# Patient Record
Sex: Male | Born: 1945 | Race: White | Hispanic: No | Marital: Married | Smoking: Former smoker
Health system: Southern US, Community
[De-identification: ages and names within clinical notes are randomized; demographics above are authoritative.]

## PROBLEM LIST (undated history)

## (undated) DIAGNOSIS — E119 Type 2 diabetes mellitus without complications: Secondary | ICD-10-CM

## (undated) DIAGNOSIS — I10 Essential (primary) hypertension: Secondary | ICD-10-CM

## (undated) HISTORY — PX: LEG SURGERY: SHX1003

## (undated) HISTORY — PX: OTHER SURGICAL HISTORY: SHX169

---

## 2013-03-10 ENCOUNTER — Emergency Department (HOSPITAL_COMMUNITY)
Admission: EM | Admit: 2013-03-10 | Discharge: 2013-03-10 | Disposition: A | Discharge status: Home / Self Care | Payer: Medicare Other | Attending: Emergency Medicine | Admitting: Emergency Medicine

## 2013-03-10 ENCOUNTER — Encounter (HOSPITAL_COMMUNITY): Payer: Self-pay | Admitting: Emergency Medicine

## 2013-03-10 DIAGNOSIS — X58XXXA Exposure to other specified factors, initial encounter: Secondary | ICD-10-CM | POA: Insufficient documentation

## 2013-03-10 DIAGNOSIS — Y929 Unspecified place or not applicable: Secondary | ICD-10-CM | POA: Diagnosis not present

## 2013-03-10 DIAGNOSIS — I10 Essential (primary) hypertension: Secondary | ICD-10-CM | POA: Diagnosis not present

## 2013-03-10 DIAGNOSIS — S01309A Unspecified open wound of unspecified ear, initial encounter: Secondary | ICD-10-CM | POA: Diagnosis not present

## 2013-03-10 DIAGNOSIS — S0993XA Unspecified injury of face, initial encounter: Secondary | ICD-10-CM | POA: Diagnosis present

## 2013-03-10 DIAGNOSIS — Y9389 Activity, other specified: Secondary | ICD-10-CM | POA: Diagnosis not present

## 2013-03-10 DIAGNOSIS — IMO0002 Reserved for concepts with insufficient information to code with codable children: Secondary | ICD-10-CM

## 2013-03-10 DIAGNOSIS — E119 Type 2 diabetes mellitus without complications: Secondary | ICD-10-CM | POA: Diagnosis not present

## 2013-03-10 DIAGNOSIS — F17200 Nicotine dependence, unspecified, uncomplicated: Secondary | ICD-10-CM | POA: Diagnosis not present

## 2013-03-10 HISTORY — DX: Type 2 diabetes mellitus without complications: E11.9

## 2013-03-10 HISTORY — DX: Essential (primary) hypertension: I10

## 2013-03-10 MED ORDER — SODIUM CHLORIDE 0.9 % IV BOLUS (SEPSIS): 500.0000 mL | Freq: Once | INTRAVENOUS | Status: DC

## 2013-03-10 NOTE — ED Provider Notes (Signed)
Medical screening examination/treatment/procedure(s) were conducted as a shared visit with non-physician practitioner(s) and myself.  I personally evaluated the patient during the encounter  Picked scab on ear, bleeding still at this time. Wound seal placed with hemostasis achieved. Not anticoagulated. Stable for discharge  Dagmar Hait, MD 03/10/13 2314

## 2013-03-10 NOTE — ED Notes (Signed)
PA at bedside.

## 2013-03-10 NOTE — ED Provider Notes (Signed)
CSN: [id number]     Arrival date & time 03/10/13  2046 History   First MD Initiated Contact with Patient 03/10/13 2057     No chief complaint on file.  (Consider location/radiation/quality/duration/timing/severity/associated sxs/prior Treatment) HPI  Darrell Carr is a 67 y.o. male past medical history significant for hypertension and non-insulin-dependent diabetes, not anticoagulated complaining of heart control bleeding from right ear. Patient picked a scab at approximately 5 PM and has not been able to control the bleeding since that time. He has been applying pressure without success. Denies chest pain, shortness of breath, dizziness weakness palpitations.  Past Medical History  Diagnosis Date  . Hypertension   . Diabetes mellitus without complication    Past Surgical History  Procedure Laterality Date  . Arm surgery    . Leg surgery     No family history on file. History  Substance Use Topics  . Smoking status: Current Every Day Smoker  . Smokeless tobacco: Not on file  . Alcohol Use: No    Review of Systems 10 systems reviewed and found to be negative, except as noted in the HPI   Allergies  Review of patient's allergies indicates no known allergies.  Home Medications  No current outpatient prescriptions on file. BP 172/78  Pulse 72  Temp(Src) 98.6 F (37 C)  Resp 16  SpO2 96% Physical Exam  Nursing note and vitals reviewed. Constitutional: He is oriented to person, place, and time. He appears well-developed and well-nourished. No distress.  HENT:  Head: Normocephalic.  Ears:  Mouth/Throat: Oropharynx is clear and moist.  No conjunctival pallor  Eyes: Conjunctivae and EOM are normal. Pupils are equal, round, and reactive to light.  Neck: Normal range of motion.  Cardiovascular: Normal rate, regular rhythm and intact distal pulses.   Pulmonary/Chest: Effort normal and breath sounds normal. No stridor. No respiratory distress. He has no wheezes. He has no  rales. He exhibits no tenderness.  Abdominal: Soft. Bowel sounds are normal. He exhibits no distension and no mass. There is no tenderness. There is no rebound and no guarding.  Musculoskeletal: Normal range of motion.  Neurological: He is alert and oriented to person, place, and time.  Psychiatric: He has a normal mood and affect.    ED Course  Procedures (including critical care time)  9:18 PM woundsseal powder applied with good hemostasis   MDM   1. Laceration     Filed Vitals:   03/10/13 2050 03/10/13 2107  BP: 164/90 172/78  Pulse: 88 72  Temp: 98.6 F (37 C)   Resp: 16 16  SpO2: 96% 96%     Darrell Carr is a 67 y.o. male with small venous bleeding to the right ear after he picked a scab 4 hours ago. No chest pain weakness shortness of breath dizziness or palpitations. Hemostasis with wound seal powder. Patient observed in the ED.   This is a shared visit with the attending physician who personally evaluated the patient and agrees with the care plan.    Pt is hemodynamically stable, appropriate for, and amenable to discharge at this time. Pt verbalized understanding and agrees with care plan. All questions answered. Outpatient follow-up and specific return precautions discussed.    Note: Portions of this report may have been transcribed using voice recognition software. Every effort was made to ensure accuracy; however, inadvertent computerized transcription errors may be present      Wynetta Emery, PA-C 03/10/13 2142

## 2013-03-10 NOTE — ED Notes (Signed)
Pt's ear not bleeding at this time.

## 2013-03-10 NOTE — ED Notes (Signed)
Pt. removed a " scab" at his right ear this evening , presents with bleeding right outer ear skin , pressure dressing applied.

## 2013-03-10 NOTE — Discharge Instructions (Signed)
Try not to touch the right ear.  Please contact your primary care doctor and let them know that you were seen in the emergency room. They must obtain records for evaluation and further management.   Follow with your primary care doctor for a check up in the next 24-48 hours.  Return to the emergency room IMMEDIATELY if you have any NEW or WORSENING symptoms   Laceration Care, Adult A laceration is a cut or lesion that goes through all layers of the skin and into the tissue just beneath the skin. TREATMENT  Some lacerations may not require closure. Some lacerations may not be able to be closed due to an increased risk of infection. It is important to see your caregiver as soon as possible after an injury to minimize the risk of infection and maximize the opportunity for successful closure. If closure is appropriate, pain medicines may be given, if needed. The wound will be cleaned to help prevent infection. Your caregiver will use stitches (sutures), staples, wound glue (adhesive), or skin adhesive strips to repair the laceration. These tools bring the skin edges together to allow for faster healing and a better cosmetic outcome. However, all wounds will heal with a scar. Once the wound has healed, scarring can be minimized by covering the wound with sunscreen during the day for 1 full year. HOME CARE INSTRUCTIONS  For sutures or staples:  Keep the wound clean and dry.  If you were given a bandage (dressing), you should change it at least once a day. Also, change the dressing if it becomes wet or dirty, or as directed by your caregiver.  Wash the wound with soap and water 2 times a day. Rinse the wound off with water to remove all soap. Pat the wound dry with a clean towel.  After cleaning, apply a thin layer of the antibiotic ointment as recommended by your caregiver. This will help prevent infection and keep the dressing from sticking.  You may shower as usual after the first 24 hours. Do  not soak the wound in water until the sutures are removed.  Only take over-the-counter or prescription medicines for pain, discomfort, or fever as directed by your caregiver.  Get your sutures or staples removed as directed by your caregiver. For skin adhesive strips:  Keep the wound clean and dry.  Do not get the skin adhesive strips wet. You may bathe carefully, using caution to keep the wound dry.  If the wound gets wet, pat it dry with a clean towel.  Skin adhesive strips will fall off on their own. You may trim the strips as the wound heals. Do not remove skin adhesive strips that are still stuck to the wound. They will fall off in time. For wound adhesive:  You may briefly wet your wound in the shower or bath. Do not soak or scrub the wound. Do not swim. Avoid periods of heavy perspiration until the skin adhesive has fallen off on its own. After showering or bathing, gently pat the wound dry with a clean towel.  Do not apply liquid medicine, cream medicine, or ointment medicine to your wound while the skin adhesive is in place. This may loosen the film before your wound is healed.  If a dressing is placed over the wound, be careful not to apply tape directly over the skin adhesive. This may cause the adhesive to be pulled off before the wound is healed.  Avoid prolonged exposure to sunlight or tanning lamps while the  skin adhesive is in place. Exposure to ultraviolet light in the first year will darken the scar.  The skin adhesive will usually remain in place for 5 to 10 days, then naturally fall off the skin. Do not pick at the adhesive film. You may need a tetanus shot if:  You cannot remember when you had your last tetanus shot.  You have never had a tetanus shot. If you get a tetanus shot, your arm may swell, get red, and feel warm to the touch. This is common and not a problem. If you need a tetanus shot and you choose not to have one, there is a rare chance of getting  tetanus. Sickness from tetanus can be serious. SEEK MEDICAL CARE IF:   You have redness, swelling, or increasing pain in the wound.  You see a red line that goes away from the wound.  You have yellowish-white fluid (pus) coming from the wound.  You have a fever.  You notice a bad smell coming from the wound or dressing.  Your wound breaks open before or after sutures have been removed.  You notice something coming out of the wound such as wood or glass.  Your wound is on your hand or foot and you cannot move a finger or toe. SEEK IMMEDIATE MEDICAL CARE IF:   Your pain is not controlled with prescribed medicine.  You have severe swelling around the wound causing pain and numbness or a change in color in your arm, hand, leg, or foot.  Your wound splits open and starts bleeding.  You have worsening numbness, weakness, or loss of function of any joint around or beyond the wound.  You develop painful lumps near the wound or on the skin anywhere on your body. MAKE SURE YOU:   Understand these instructions.  Will watch your condition.  Will get help right away if you are not doing well or get worse. Document Released: 05/30/2005 Document Revised: 08/22/2011 Document Reviewed: 11/23/2010 [facility] Patient Information 2014 [place], [place].

## 2016-05-06 ENCOUNTER — Emergency Department (HOSPITAL_COMMUNITY)
Admission: EM | Admit: 2016-05-06 | Discharge: 2016-05-06 | Disposition: A | Discharge status: Home / Self Care | Payer: Non-veteran care | Attending: Emergency Medicine | Admitting: Emergency Medicine

## 2016-05-06 ENCOUNTER — Encounter (HOSPITAL_COMMUNITY): Payer: Self-pay

## 2016-05-06 DIAGNOSIS — I10 Essential (primary) hypertension: Secondary | ICD-10-CM | POA: Insufficient documentation

## 2016-05-06 DIAGNOSIS — Z87891 Personal history of nicotine dependence: Secondary | ICD-10-CM | POA: Insufficient documentation

## 2016-05-06 DIAGNOSIS — Z7982 Long term (current) use of aspirin: Secondary | ICD-10-CM | POA: Diagnosis not present

## 2016-05-06 DIAGNOSIS — E119 Type 2 diabetes mellitus without complications: Secondary | ICD-10-CM | POA: Diagnosis not present

## 2016-05-06 DIAGNOSIS — R339 Retention of urine, unspecified: Secondary | ICD-10-CM | POA: Diagnosis present

## 2016-05-06 DIAGNOSIS — Z7984 Long term (current) use of oral hypoglycemic drugs: Secondary | ICD-10-CM | POA: Insufficient documentation

## 2016-05-06 NOTE — ED Notes (Signed)
attempted to insert urinary cathter with MD Hyacinth MeekerMiller with coude and unable to place. MD miller as contacted urology.

## 2016-05-06 NOTE — ED Triage Notes (Signed)
Attempted to insert foley. Will move pt to room to have coude cath placed.

## 2016-05-06 NOTE — Discharge Instructions (Signed)
Keep your foley catheter in place until you follow up with the Urologist  Return If you develop severe or worsening pain, fever, vomiting

## 2016-05-06 NOTE — ED Provider Notes (Signed)
[facility]-EMERGENCY DEPT Provider Note   CSN: [id number] Arrival date & time: 05/06/16  1610     History   Chief Complaint Chief Complaint  Patient presents with  . Urinary Retention    HPI Darrell Carr is a 70 y.o. male.  HPI  The patient is a 70-year-old male, uncircumcised, history of diabetes and hypertension, recently had a mini thoracotomy on the right side and during that procedure to take out a nodule, they also placed a Foley catheter. The patient states that he was unable to urinate afterwards and so they had to replace the Foley catheter. He presented to the urologist [facility] at [facility] in [place] today and the Foley catheter was removed. He was told to call back and go to the doctor immediately if he was unable to urinate within one hour.  The patient states that this was this morning, he has been multiple hours now since he has urinated. He has pain in his lower abdomen and his significant fullness. He has no fevers or chills. He reports that this is all because he is uncircumcised and he is scheduled to have a circumcision in December. He requests the Foley catheter to be replaced at this time.  Past Medical History:  Diagnosis Date  . Diabetes mellitus without complication (HCC)   . Hypertension     There are no active problems to display for this patient.   Past Surgical History:  Procedure Laterality Date  . arm surgery    . LEG SURGERY         Home Medications    Prior to Admission medications   Medication Sig Start Date End Date Taking? Authorizing Provider  acetaminophen (TYLENOL) 500 MG tablet Take 500-1,000 mg by mouth every 6 (six) hours as needed (for pain). RAPID-RELEASE GELS   Yes Historical Provider, MD  albuterol (VENTOLIN HFA) 108 (90 Base) MCG/ACT inhaler Inhale 2 puffs into the lungs 4 (four) times daily as needed for shortness of breath.   Yes Historical Provider, MD  amLODipine (NORVASC) 10 MG tablet Take 5 mg by mouth daily.   Yes  Historical Provider, MD  aspirin 81 MG tablet Take 81 mg by mouth daily.   Yes Historical Provider, MD  hydrochlorothiazide (HYDRODIURIL) 25 MG tablet Take 25 mg by mouth daily.   Yes Historical Provider, MD  hydroxypropyl methylcellulose / hypromellose (ISOPTO TEARS / GONIOVISC) 2.5 % ophthalmic solution Place 1 drop into both eyes 4 (four) times daily.   Yes Historical Provider, MD  lisinopril (PRINIVIL,ZESTRIL) 40 MG tablet Take 40 mg by mouth daily.   Yes Historical Provider, MD  metFORMIN (GLUCOPHAGE) 1000 MG tablet Take 1,000 mg by mouth 2 (two) times daily with a meal.   Yes Historical Provider, MD  senna (SENOKOT) 8.6 MG tablet Take 2 tablets by mouth at bedtime.   Yes Historical Provider, MD  simvastatin (ZOCOR) 20 MG tablet Take 20 mg by mouth at bedtime.   Yes Historical Provider, MD  tamsulosin (FLOMAX) 0.4 MG CAPS capsule Take 0.4 mg by mouth daily.   Yes Historical Provider, MD  tiotropium (SPIRIVA) 18 MCG inhalation capsule Place 18 mcg into inhaler and inhale daily.   Yes Historical Provider, MD    Family History History reviewed. No pertinent family history.  Social History Social History  Substance Use Topics  . Smoking status: Former Smoker    Quit date: 04/13/2016  . Smokeless tobacco: Never Used  . Alcohol use No     Allergies  Patient has no known allergies.   Review of Systems Review of Systems  All other systems reviewed and are negative.    Physical Exam Updated Vital Signs BP 146/84   Pulse (!) 125   Temp 98.4 F (36.9 C) (Oral)   Resp 16   Ht 5\' 8"  (1.727 m)   Wt 140 lb (63.5 kg)   SpO2 94%   BMI 21.29 kg/m   Physical Exam  Constitutional: He appears well-developed and well-nourished. No distress.  HENT:  Head: Normocephalic and atraumatic.  Mouth/Throat: Oropharynx is clear and moist. No oropharyngeal exudate.  Eyes: Conjunctivae and EOM are normal. Pupils are equal, round, and reactive to light. Right eye exhibits no discharge. Left  eye exhibits no discharge. No scleral icterus.  Neck: Normal range of motion. Neck supple. No JVD present. No thyromegaly present.  Cardiovascular: Normal rate, regular rhythm, normal heart sounds and intact distal pulses.  Exam reveals no gallop and no friction rub.   No murmur heard. Pulmonary/Chest: Effort normal and breath sounds normal. No respiratory distress. He has no wheezes. He has no rales.  Abdominal: Soft. Bowel sounds are normal. He exhibits no distension and no mass. There is tenderness ( Mild tenderness and fullness of the lower abdomen, 800 mL of urine seen on bladder scan).  Genitourinary:  Genitourinary Comments: Normal appearing uncircumcised penis  Musculoskeletal: Normal range of motion. He exhibits no edema or tenderness.  Lymphadenopathy:    He has no cervical adenopathy.  Neurological: He is alert. Coordination normal.  Skin: Skin is warm and dry. No rash noted. No erythema.  Psychiatric: He has a normal mood and affect. His behavior is normal.  Nursing note and vitals reviewed.    ED Treatments / Results  Labs (all labs ordered are listed, but only abnormal results are displayed) Labs Reviewed - No data to display   Radiology No results found.  Procedures Procedures (including critical care time)  Medications Ordered in ED Medications - No data to display   Initial Impression / Assessment and Plan / ED Course  I have reviewed the triage vital signs and the nursing notes.  Pertinent labs & imaging results that were available during my care of the patient were reviewed by me and considered in my medical decision making (see chart for details).  Clinical Course    Attempt at foley placement unsuccessful with 14 french, then 16 french and 16 french coude - D/w Urologist - will come to see patient. To assist with foley placement.  Urologist Dr. Sherron MondayMacDiarmid has placed foley successful - pt to be d/c. And [facility]  Final Clinical  Impressions(s) / ED Diagnoses   Final diagnoses:  Urinary retention    New Prescriptions New Prescriptions   No medications on file     Eber HongBrian Chasmine Lender, MD 05/06/16 1901

## 2016-05-06 NOTE — Consult Note (Signed)
Urology Consult  Referring physician: Fleet ContrasB Miller Reason for referral: unable to insert catheter/retention  Chief Complaint: unable to insert catheter/retention  History of Present Illness: Elderly male had foley taken out today in [place] [facility]; unable to void and pass catheter; had catheter for one month from [facility]; taken out today and sent home???; came in today not able to void ? Soon to have surgery [facility] for [facility]  Not in pain No UTI/GU surgery history  Modifying factors: There are no other modifying factors  Associated signs and symptoms: There are no other associated signs and symptoms Aggravating and relieving factors: There are no other aggravating or relieving factors Severity: Moderate Duration: Persistent  Past Medical History:  Diagnosis Date  . Diabetes mellitus without complication (HCC)   . Hypertension    Past Surgical History:  Procedure Laterality Date  . arm surgery    . LEG SURGERY      Medications: I have reviewed the patient's current medications. Allergies: No Known Allergies  History reviewed. No pertinent family history. Social History:  reports that he quit smoking about 3 weeks ago. He has never used smokeless tobacco. He reports that he does not drink alcohol or use drugs.  ROS: All systems are reviewed and negative except as noted. Rest negative  Physical Exam:  Vital signs in last 24 hours: Temp:  [98.4 F (36.9 C)] 98.4 F (36.9 C) (11/24 1619) Pulse Rate:  [130] 130 (11/24 1619) Resp:  [20] 20 (11/24 1619) BP: (165)/(92) 165/92 (11/24 1619) SpO2:  [98 %] 98 % (11/24 1619) Weight:  [63.5 kg (140 lb)] 63.5 kg (140 lb) (11/24 1619)  Cardiovascular: Skin warm; not flushed Respiratory: Breaths quiet; no shortness of breath Abdomen: No masses Neurological: Normal sensation to touch Musculoskeletal: Normal motor function arms and legs Lymphatics: No inguinal adenopathy Skin: No rashes Genitourinary:non toxic Blood at  meatus  Laboratory Data:  No results found for this or any previous visit (from the past 72 hour(s)). No results found for this or any previous visit (from the past 240 hour(s)). Creatinine: No results for input(s): CREATININE in the last 168 hours.  Xrays: See report/chart none  Impression/Assessment:  Retention  Very surprised patient was given a trial of voiding on a Friday to an out of town patient  Plan:  14 Fr coude went in easy with no blood and high output   Jaclyn Andy A 05/06/2016, 6:30 PM

## 2016-05-06 NOTE — ED Triage Notes (Signed)
Pt reports that he had foley catheter removed at the [facility] earlier today. He has not urinated since. He reports abd bladder. Pt was prescribed flomax as well.

## 2016-05-06 NOTE — ED Notes (Signed)
Urine bag changed to a leg bag.

## 2016-05-31 ENCOUNTER — Ambulatory Visit
Admission: RE | Admit: 2016-05-31 | Discharge: 2016-05-31 | Disposition: A | Discharge status: Home / Self Care | Payer: Self-pay | Source: Ambulatory Visit | Attending: Otolaryngology | Admitting: Otolaryngology

## 2016-05-31 ENCOUNTER — Other Ambulatory Visit (HOSPITAL_COMMUNITY): Payer: Self-pay | Admitting: Otolaryngology

## 2016-05-31 DIAGNOSIS — R52 Pain, unspecified: Secondary | ICD-10-CM

## 2016-05-31 DIAGNOSIS — K118 Other diseases of salivary glands: Secondary | ICD-10-CM

## 2016-06-02 ENCOUNTER — Other Ambulatory Visit: Payer: Self-pay | Admitting: Student

## 2016-06-03 ENCOUNTER — Other Ambulatory Visit: Payer: Self-pay | Admitting: Radiology

## 2016-06-03 ENCOUNTER — Other Ambulatory Visit: Payer: Self-pay | Admitting: Student

## 2016-06-07 ENCOUNTER — Encounter (HOSPITAL_COMMUNITY): Payer: Self-pay

## 2016-06-07 ENCOUNTER — Ambulatory Visit (HOSPITAL_COMMUNITY)
Admission: RE | Admit: 2016-06-07 | Discharge: 2016-06-07 | Disposition: A | Discharge status: Home / Self Care | Payer: Non-veteran care | Source: Ambulatory Visit | Attending: Otolaryngology | Admitting: Otolaryngology

## 2016-06-07 DIAGNOSIS — K119 Disease of salivary gland, unspecified: Secondary | ICD-10-CM | POA: Diagnosis present

## 2016-06-07 DIAGNOSIS — J449 Chronic obstructive pulmonary disease, unspecified: Secondary | ICD-10-CM | POA: Insufficient documentation

## 2016-06-07 DIAGNOSIS — I10 Essential (primary) hypertension: Secondary | ICD-10-CM | POA: Insufficient documentation

## 2016-06-07 DIAGNOSIS — Z7982 Long term (current) use of aspirin: Secondary | ICD-10-CM | POA: Insufficient documentation

## 2016-06-07 DIAGNOSIS — Z7984 Long term (current) use of oral hypoglycemic drugs: Secondary | ICD-10-CM | POA: Insufficient documentation

## 2016-06-07 DIAGNOSIS — Z87891 Personal history of nicotine dependence: Secondary | ICD-10-CM | POA: Insufficient documentation

## 2016-06-07 DIAGNOSIS — E119 Type 2 diabetes mellitus without complications: Secondary | ICD-10-CM | POA: Insufficient documentation

## 2016-06-07 DIAGNOSIS — K118 Other diseases of salivary glands: Secondary | ICD-10-CM

## 2016-06-07 LAB — CBC
HCT: 39.1 % (ref 39.0–52.0)
Hemoglobin: 13.1 g/dL (ref 13.0–17.0)
MCH: 28.7 pg (ref 26.0–34.0)
MCHC: 33.5 g/dL (ref 30.0–36.0)
MCV: 85.6 fL (ref 78.0–100.0)
PLATELETS: 327 10*3/uL (ref 150–400)
RBC: 4.57 MIL/uL (ref 4.22–5.81)
RDW: 13.6 % (ref 11.5–15.5)
WBC: 11 10*3/uL — AB (ref 4.0–10.5)

## 2016-06-07 LAB — PROTIME-INR
INR: 0.97
PROTHROMBIN TIME: 12.8 s (ref 11.4–15.2)

## 2016-06-07 LAB — GLUCOSE, CAPILLARY
GLUCOSE-CAPILLARY: 111 mg/dL — AB (ref 65–99)
GLUCOSE-CAPILLARY: 92 mg/dL (ref 65–99)

## 2016-06-07 LAB — APTT: aPTT: 31 seconds (ref 24–36)

## 2016-06-07 MED ORDER — SODIUM CHLORIDE 0.9 % IV SOLN: INTRAVENOUS | Status: DC

## 2016-06-07 MED ORDER — FENTANYL CITRATE (PF) 100 MCG/2ML IJ SOLN
INTRAMUSCULAR | Status: AC
  Filled 2016-06-07: qty 2

## 2016-06-07 MED ORDER — LIDOCAINE HCL (PF) 1 % IJ SOLN
INTRAMUSCULAR | Status: AC
  Filled 2016-06-07: qty 10

## 2016-06-07 MED ORDER — MIDAZOLAM HCL 2 MG/2ML IJ SOLN
INTRAMUSCULAR | Status: AC
  Filled 2016-06-07: qty 2

## 2016-06-07 MED ORDER — MIDAZOLAM HCL 2 MG/2ML IJ SOLN
INTRAMUSCULAR | Status: AC | PRN
  Administered 2016-06-07: 1 mg via INTRAVENOUS

## 2016-06-07 MED ORDER — FENTANYL CITRATE (PF) 100 MCG/2ML IJ SOLN
INTRAMUSCULAR | Status: AC | PRN
  Administered 2016-06-07: 25 ug via INTRAVENOUS

## 2016-06-07 NOTE — H&P (Signed)
Chief Complaint: Hypermetabolic Parotid mass   Referring Physician(s): Deniece PortelaSatteson,Adam C  Supervising Physician: Gilmer MorWagner, Jaime  Patient [facility]: [facility] - Out[facility]  History of Present Illness: Darrell Carr is a 70 y.o. male with COPD, HTN, and DM who is usually seen at the [facility] in [place].  He is here today for biopsy of his left parotid.  He denies any history of cancer and there is no documented history in the info from the [facility].  He does have history of smoking and Asbestos exposure per [facility] records. I am unsure what prompted a PET scan.  He thinks the area we are going to biopsy is on the back of the neck, I explained the location of the area.  He did have 2 bites of food this morning at 6 am. He has been NPO since that time  He does not take blood thinners.   Past Medical History:  Diagnosis Date  . Diabetes mellitus without complication (HCC)   . Hypertension     Past Surgical History:  Procedure Laterality Date  . arm surgery    . LEG SURGERY      Allergies: Patient has no known allergies.  Medications: Prior to Admission medications   Medication Sig Start Date End Date Taking? Authorizing Provider  acetaminophen (TYLENOL) 500 MG tablet Take 500-1,000 mg by mouth every 6 (six) hours as needed (for pain). RAPID-RELEASE GELS   Yes Historical Provider, MD  amLODipine (NORVASC) 10 MG tablet Take 5 mg by mouth daily.   Yes Historical Provider, MD  aspirin 81 MG tablet Take 81 mg by mouth daily.   Yes Historical Provider, MD  hydrochlorothiazide (HYDRODIURIL) 25 MG tablet Take 25 mg by mouth daily.   Yes Historical Provider, MD  hydroxypropyl methylcellulose / hypromellose (ISOPTO TEARS / GONIOVISC) 2.5 % ophthalmic solution Place 1 drop into both eyes 4 (four) times daily.   Yes Historical Provider, MD  lisinopril (PRINIVIL,ZESTRIL) 40 MG tablet Take 40 mg by mouth daily.   Yes Historical Provider, MD  metFORMIN (GLUCOPHAGE) 1000 MG tablet Take 1,000 mg by mouth 2  (two) times daily with a meal.   Yes Historical Provider, MD  senna (SENOKOT) 8.6 MG tablet Take 2 tablets by mouth at bedtime.   Yes Historical Provider, MD  simvastatin (ZOCOR) 20 MG tablet Take 20 mg by mouth at bedtime.   Yes Historical Provider, MD  tamsulosin (FLOMAX) 0.4 MG CAPS capsule Take 0.4 mg by mouth daily.   Yes Historical Provider, MD  albuterol (VENTOLIN HFA) 108 (90 Base) MCG/ACT inhaler Inhale 2 puffs into the lungs 4 (four) times daily as needed for shortness of breath.    Historical Provider, MD  tiotropium (SPIRIVA) 18 MCG inhalation capsule Place 18 mcg into inhaler and inhale daily.    Historical Provider, MD     History reviewed. No pertinent family history.  Social History   Social History  . Marital status: Married    Spouse name: N/A  . Number of children: N/A  . Years of education: N/A   Social History Main Topics  . Smoking status: Former Smoker    Quit date: 04/13/2016  . Smokeless tobacco: Never Used  . Alcohol use No  . Drug use: No  . Sexual activity: Not Asked   Other Topics Concern  . None   Social History Narrative  . None    Review of Systems: A 12 point ROS discussed   Review of Systems  Constitutional: Negative.   HENT:  Negative.   Respiratory: Negative.   Cardiovascular: Negative.   Gastrointestinal: Negative.   Genitourinary: Negative.   Musculoskeletal: Negative.   Skin: Negative.   Neurological: Negative.   Hematological: Negative.   Psychiatric/Behavioral: Negative.     Vital Signs: BP (!) 170/87   Pulse 67   Temp 97.6 F (36.4 C) (Oral)   Resp 18   Ht 5\' 8"  (1.727 m)   Wt 140 lb (63.5 kg)   SpO2 98%   BMI 21.29 kg/m   Physical Exam  Mallampati Score:  MD Evaluation Airway: WNL Heart: WNL Abdomen: WNL Chest/ Lungs: WNL ASA  Classification: 3 Mallampati/Airway Score: One  Imaging: No results found.  Labs:  CBC:  Recent Labs  06/07/16 1011  WBC 11.0*  HGB 13.1  HCT 39.1  PLT 327     COAGS:  Recent Labs  06/07/16 1011  INR 0.97  APTT 31    BMP: No results for input(s): NA, K, CL, CO2, GLUCOSE, BUN, CALCIUM, CREATININE, GFRNONAA, GFRAA in the last 8760 hours.  Invalid input(s): CMP  LIVER FUNCTION TESTS: No results for input(s): BILITOT, AST, ALT, ALKPHOS, PROT, ALBUMIN in the last 8760 hours.  TUMOR MARKERS: No results for input(s): AFPTM, CEA, CA199, CHROMGRNA in the last 8760 hours.  Assessment and Plan:  Will proceed with parotid biopsy today by Dr. Loreta AveWagner.  Risks and Benefits discussed with the patient including, but not limited to bleeding, infection, damage to adjacent structures or low yield requiring additional tests.  All of the patient's questions were answered, patient is agreeable to proceed. Consent signed and in chart.  Thank you for this interesting consult.  I greatly enjoyed meeting Darrell Carr and look forward to participating in their care.  A copy of this report was sent to the requesting provider on this date.  Electronically Signed: Gwynneth MacleodWENDY S Yeshua Stryker 06/07/2016, 11:08 AM   I spent a total of  30 Minutes in face to face in clinical consultation, greater than 50% of which was counseling/coordinating care for parotid mass biopsy

## 2016-06-07 NOTE — Procedures (Signed)
Interventional Radiology Procedure Note  Procedure: US guided left parotid lesion FNA.  Complications: None Recommendations:  - Ok to shower tomorrow - Do not submerge for 7 days - Routine care   Signed,  Yvone NeuJaime S. Loreta AveWagner, DO

## 2016-06-07 NOTE — Sedation Documentation (Signed)
Attempted to call report to ss rn, melanie states she will call this RN back

## 2016-06-07 NOTE — Sedation Documentation (Signed)
Patient is resting comfortably. No complaints at this time. Vitals stable. 

## 2016-06-07 NOTE — Sedation Documentation (Signed)
Patient is resting comfortably. Vitals stable. Pt denies pain

## 2016-06-07 NOTE — Discharge Instructions (Signed)

## 2016-06-07 NOTE — Sedation Documentation (Signed)
Pt tolerating procedure very well. No complaints at this time 

## 2018-11-23 IMAGING — US US BIOPSY
1 series · 13 of 17 positions shown · non-contrast
Comparison: none

ADDENDUM:
Addendum created to address the exam

EXAM:
ULTRASOUND GUIDED SALIVARY GLAND BIOPSY
INDICATION: 70-year-old male with a history of incidental finding of the left
parotid gland on a PET-CT performed 03/22/2016

[Series 1: us biopsy · 0.06mm/px · 17 acquisitions, 13 frames shown]
[im 1/17]
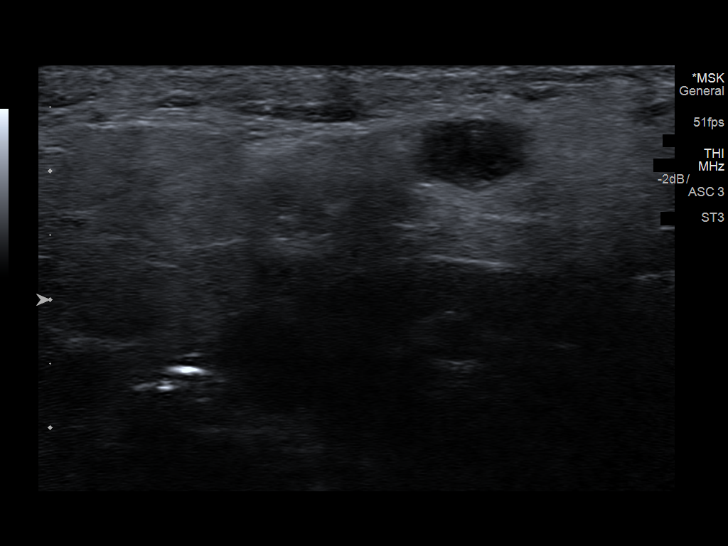
[im 2/17]
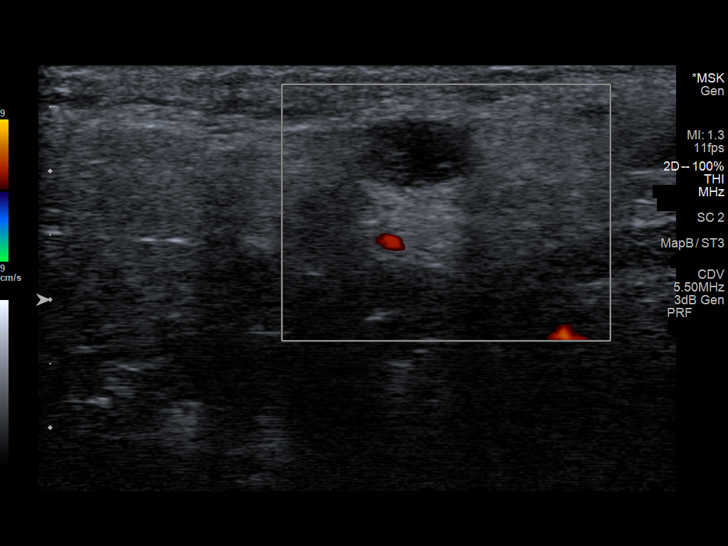
[im 4/17]
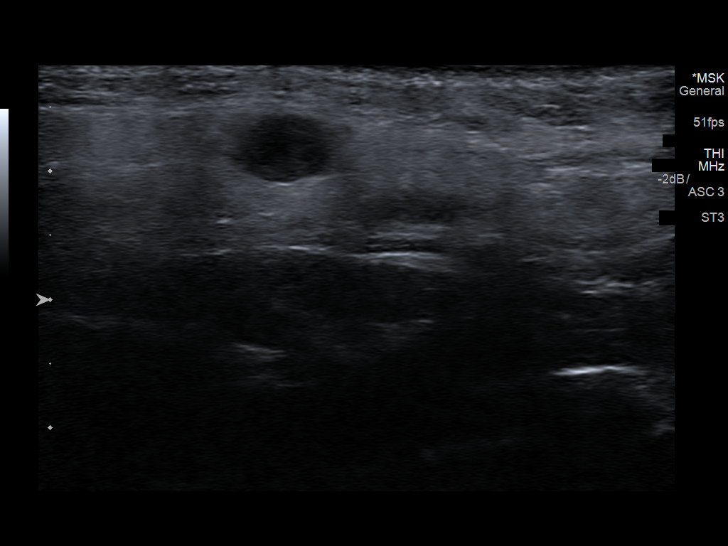
[im 5/17]
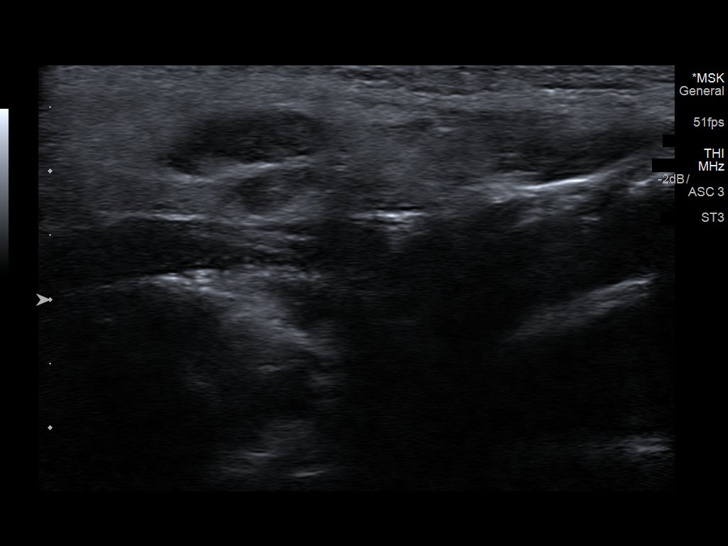
[im 6/17]
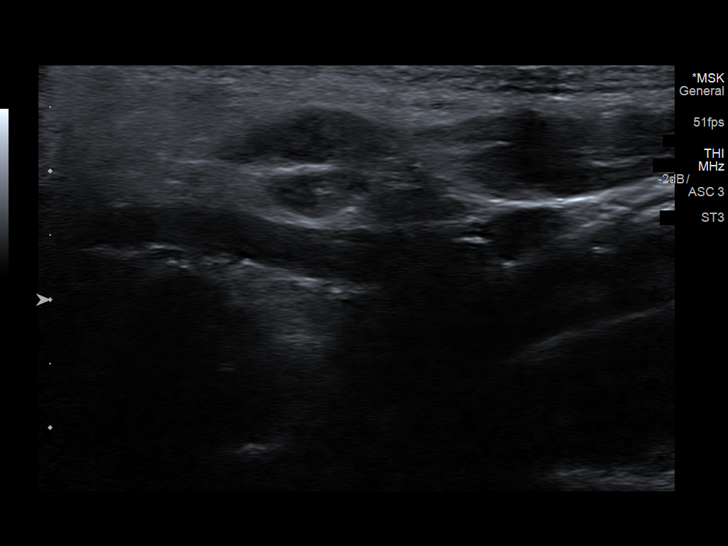
[im 8/17]
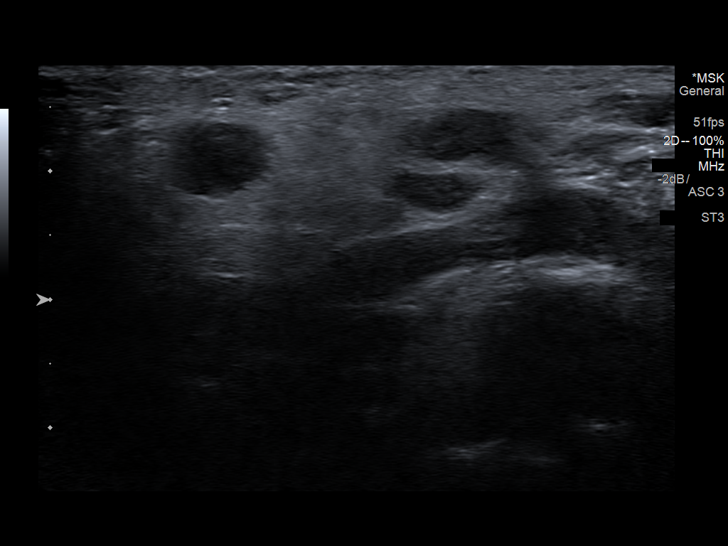
[im 9/17]
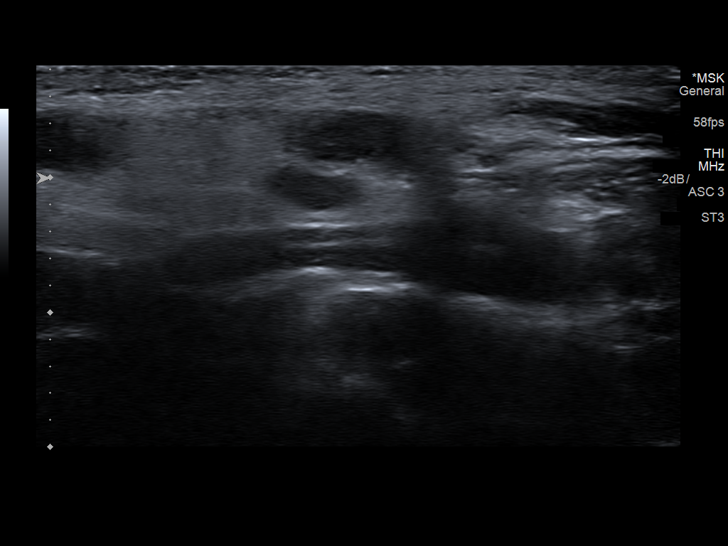
[im 10/17]
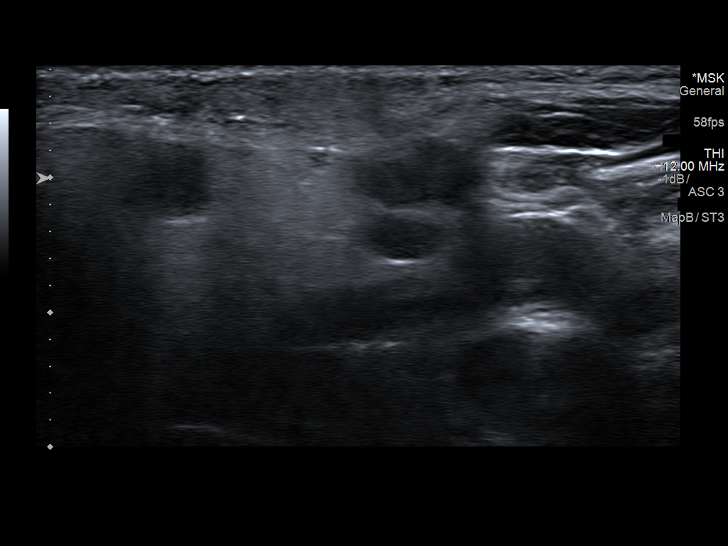
[im 12/17]
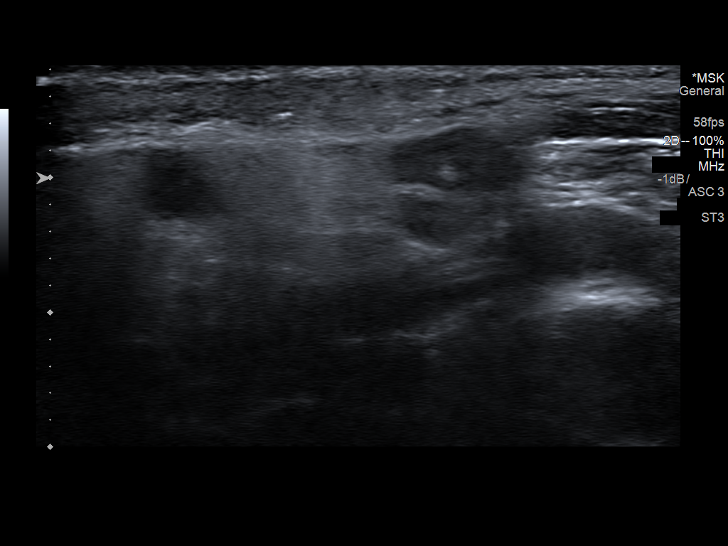
[im 13/17]
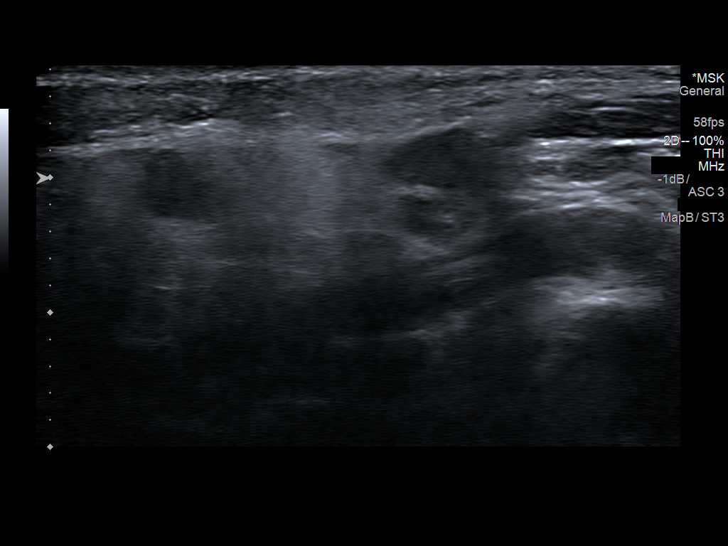
[im 14/17]
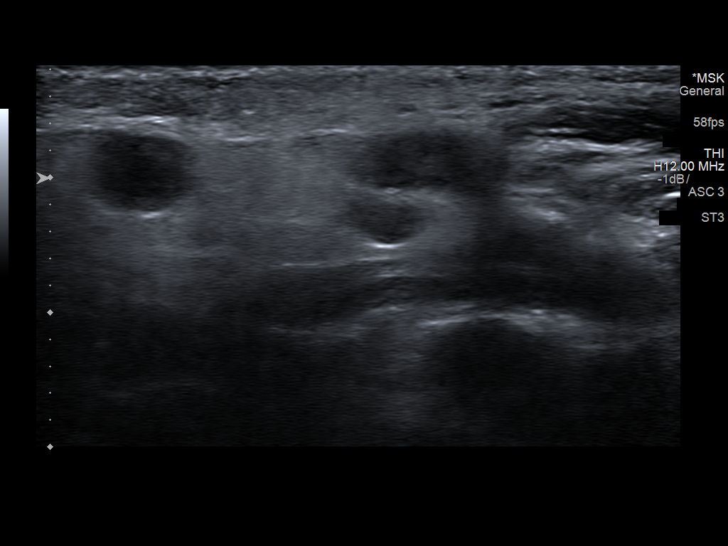
[im 16/17]
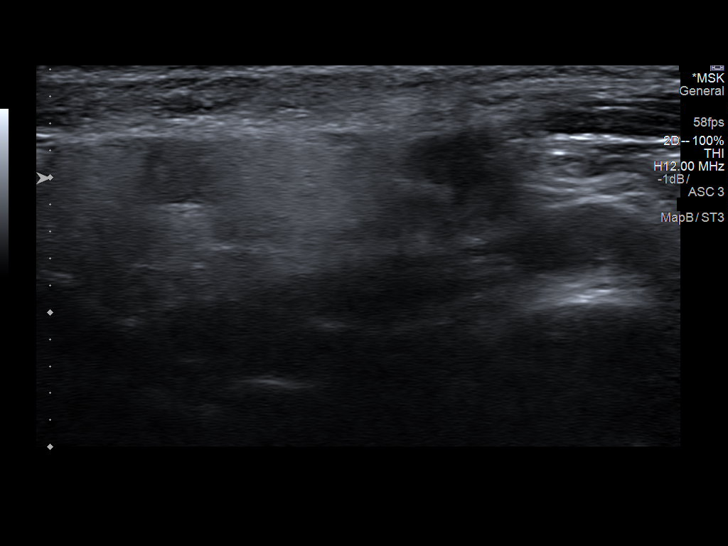
[im 17/17]
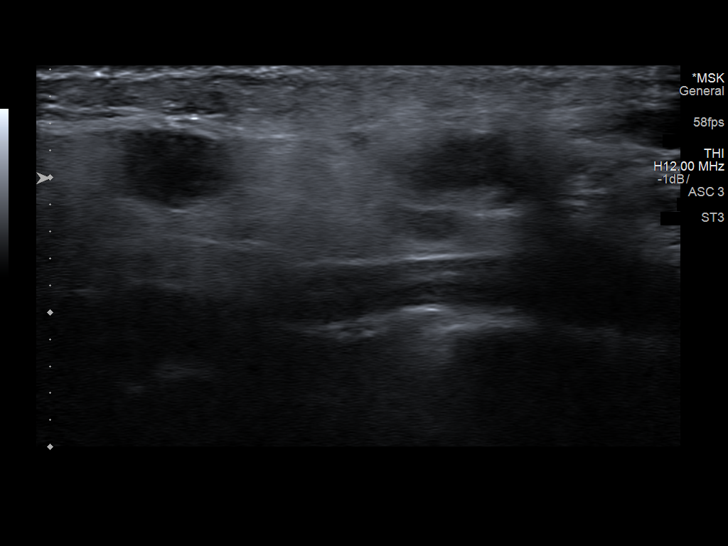

[13 of 17 positions shown; findings below may reference images not displayed]

EXAM:
ULTRASOUND BIOPSY CORE LIVER

MEDICATIONS:
None.

ANESTHESIA/SEDATION:
Moderate (conscious) sedation was employed during this procedure. A
total of Versed 1.0 mg and Fentanyl 25 mcg was administered
intravenously.

Moderate Sedation Time: 10 minutes. The patient's level of
consciousness and vital signs were monitored continuously by
radiology nursing throughout the procedure under my direct
supervision.

FLUOROSCOPY TIME:  None

COMPLICATIONS:
None

PROCEDURE:
The procedure, risks, benefits, and alternatives were explained to
the patient. Questions regarding the procedure were encouraged and
answered. The patient understands and consents to the procedure.

Ultrasound survey was performed with images stored and sent to PACs.

The left neck was prepped with Betadine in a sterile fashion, and a
sterile drape was applied covering the operative field. A sterile
gown and sterile gloves were used for the procedure. Local
anesthesia was provided with 1% Lidocaine.

Ultrasound guidance was used to infiltrate the region with 1%
lidocaine for local anesthesia. Four separate 25 gauge fine needle
biopsy were then acquired of the intra parotid nodule using
ultrasound guidance. Images were stored.

Slide preparation was performed.

Final image was stored after biopsy.

Patient tolerated the procedure well and remained hemodynamically
stable throughout.

No complications were encountered and no significant blood loss was
encounter
IMPRESSION: Status post ultrasound-guided fine-needle aspiration/biopsy of left
intra parotid nodule.
